# Patient Record
Sex: Male | Born: 1980 | Race: Black or African American | Hispanic: No | Marital: Single | Smoking: Never smoker
Health system: Southern US, Community
[De-identification: ages and names within clinical notes are randomized; demographics above are authoritative.]

---

## 2010-09-25 ENCOUNTER — Encounter: Admission: RE | Admit: 2010-09-25 | Discharge: 2010-09-25 | Payer: Self-pay | Admitting: Infectious Diseases

## 2011-10-26 IMAGING — CR DG CHEST 1V
1 series · 1 of 1 positions shown · non-contrast
Comparison: None

CLINICAL DATA: Positive TB test.

CHEST - 1 VIEW

[w chest pa]
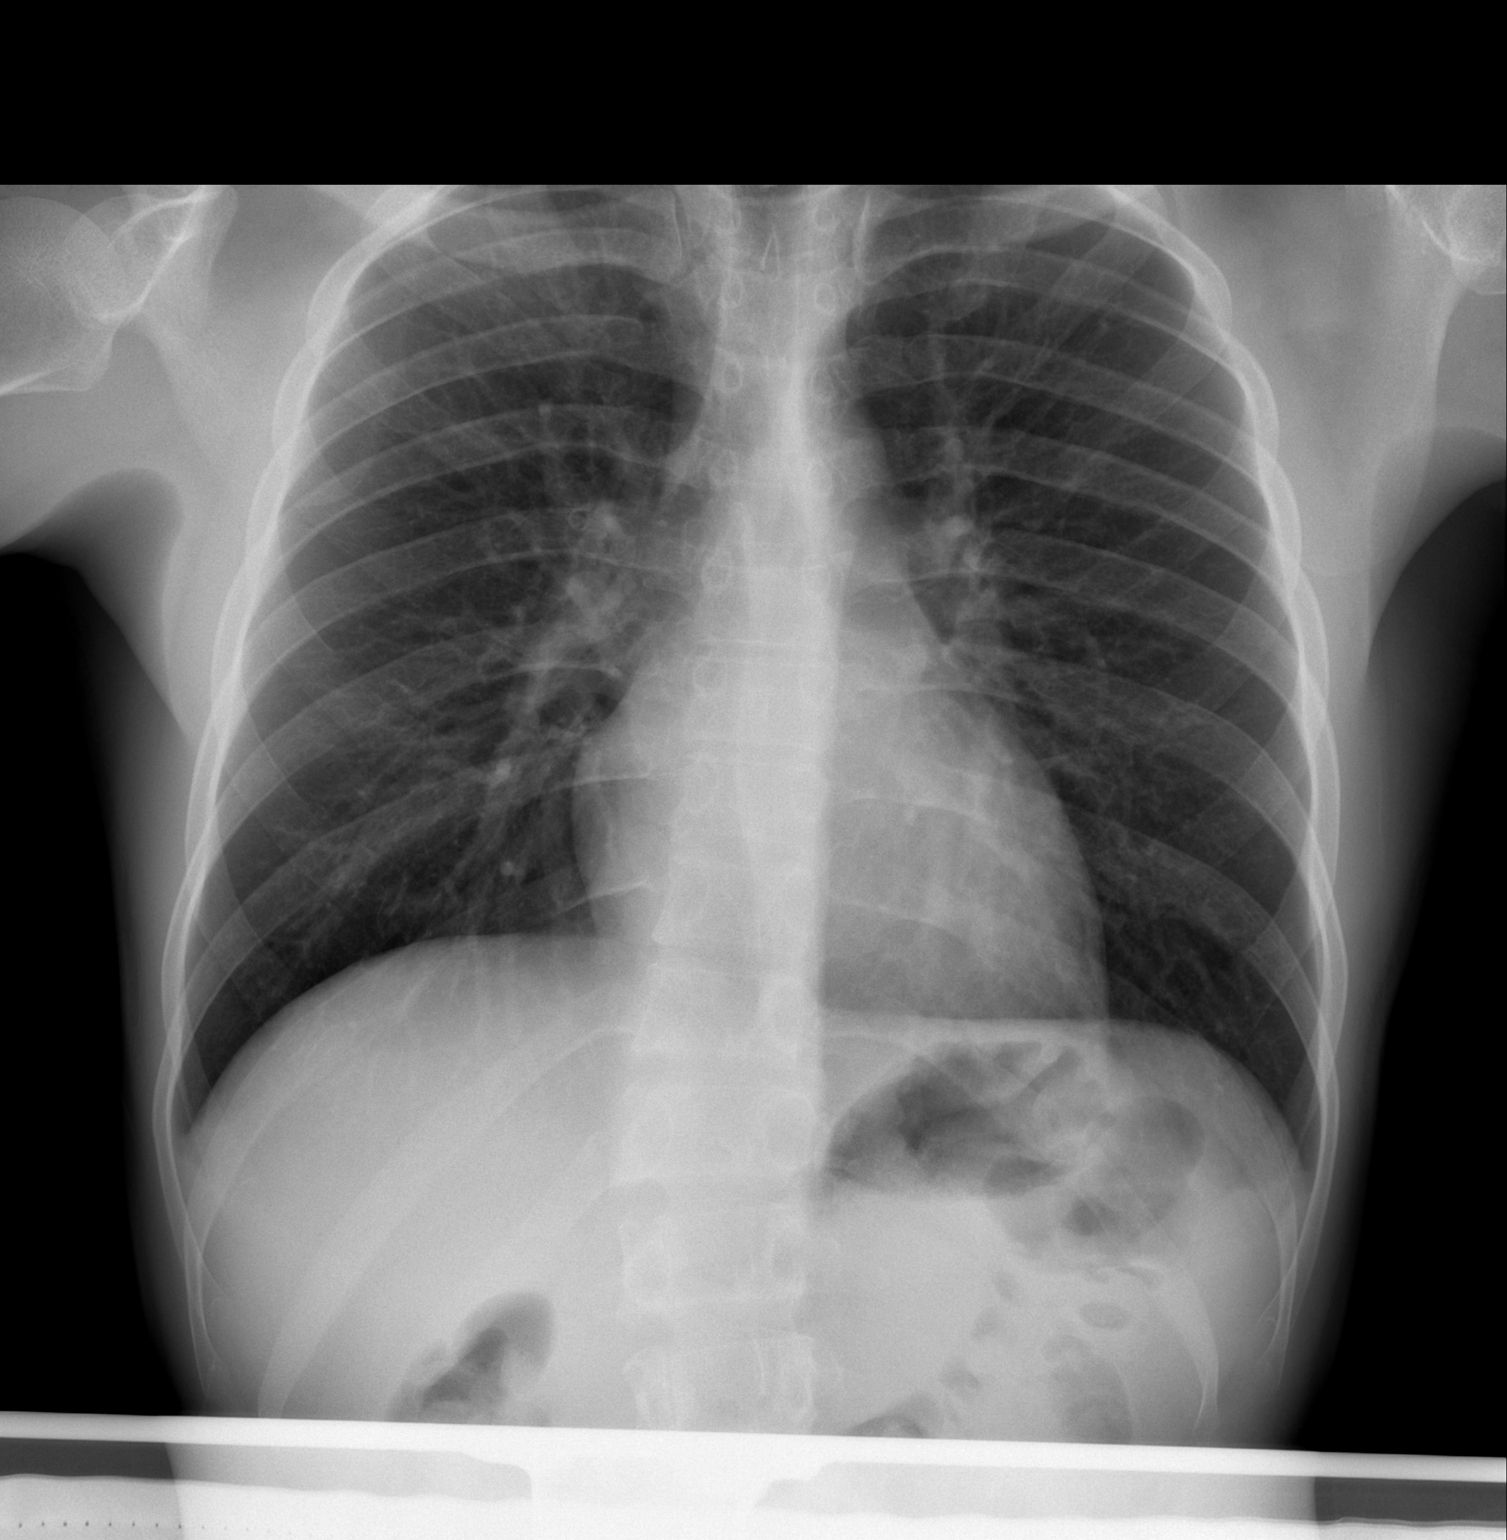

[1 of 1 positions shown; findings below may reference images not displayed]

FINDINGS: Heart and mediastinal contours are within normal limits.
No focal opacities or effusions.  No acute bony abnormality.
IMPRESSION: No active disease.

## 2017-02-27 ENCOUNTER — Ambulatory Visit: Payer: Self-pay

## 2017-02-27 ENCOUNTER — Ambulatory Visit (INDEPENDENT_AMBULATORY_CARE_PROVIDER_SITE_OTHER): Payer: BLUE CROSS/BLUE SHIELD | Admitting: Physician Assistant

## 2017-02-27 VITALS — BP 118/70 | HR 81 | Temp 98.6°F | Resp 18 | Ht 62.6 in | Wt 154.0 lb

## 2017-02-27 DIAGNOSIS — Z113 Encounter for screening for infections with a predominantly sexual mode of transmission: Secondary | ICD-10-CM | POA: Diagnosis not present

## 2017-02-27 DIAGNOSIS — Z0000 Encounter for general adult medical examination without abnormal findings: Secondary | ICD-10-CM | POA: Diagnosis not present

## 2017-02-27 DIAGNOSIS — R002 Palpitations: Secondary | ICD-10-CM

## 2017-02-27 DIAGNOSIS — Z1329 Encounter for screening for other suspected endocrine disorder: Secondary | ICD-10-CM | POA: Diagnosis not present

## 2017-02-27 DIAGNOSIS — Z1389 Encounter for screening for other disorder: Secondary | ICD-10-CM

## 2017-02-27 DIAGNOSIS — Z23 Encounter for immunization: Secondary | ICD-10-CM | POA: Diagnosis not present

## 2017-02-27 DIAGNOSIS — Z114 Encounter for screening for human immunodeficiency virus [HIV]: Secondary | ICD-10-CM | POA: Diagnosis not present

## 2017-02-27 DIAGNOSIS — Z130 Encounter for screening for diseases of the blood and blood-forming organs and certain disorders involving the immune mechanism: Secondary | ICD-10-CM | POA: Diagnosis not present

## 2017-02-27 DIAGNOSIS — Z13220 Encounter for screening for lipoid disorders: Secondary | ICD-10-CM

## 2017-02-27 DIAGNOSIS — Z136 Encounter for screening for cardiovascular disorders: Secondary | ICD-10-CM | POA: Diagnosis not present

## 2017-02-27 DIAGNOSIS — Z131 Encounter for screening for diabetes mellitus: Secondary | ICD-10-CM | POA: Diagnosis not present

## 2017-02-27 LAB — POCT URINALYSIS DIP (MANUAL ENTRY)
BILIRUBIN UA: NEGATIVE
BILIRUBIN UA: NEGATIVE
Glucose, UA: NEGATIVE
Leukocytes, UA: NEGATIVE
NITRITE UA: NEGATIVE
PH UA: 7
RBC UA: NEGATIVE
Spec Grav, UA: 1.025
Urobilinogen, UA: 1

## 2017-02-27 NOTE — Patient Instructions (Signed)
     IF you received an x-ray today, you will receive an invoice from [facility]. Please contact [facility] at [phone] with questions or concerns regarding your invoice.   IF you received labwork today, you will receive an invoice from [facility]. Please contact [facility] at [phone] with questions or concerns regarding your invoice.   Our billing staff will not be able to assist you with questions regarding bills from these companies.  You will be contacted with the lab results as soon as they are available. The fastest way to get your results is to activate your My Chart account. Instructions are located on the last page of this paperwork. If you have not heard from us regarding the results in 2 weeks, please contact this office.     

## 2017-02-27 NOTE — Progress Notes (Signed)
02/27/2017 4:35 PM   DOB: March 21, 1981 / MRN: [id number]  SUBJECTIVE:  Riley Patterson is a 36 y.o. male presenting for an annual physical.  He does not exercise formally. He is a PhD Museum/gallery exhibitions officer. He plan [place].  He and his wife Riley Patterson have three children. He has two boys, on baby and on 5 year old, and a baby girl. Never smokers.  No drugs. Wears seatbelt. No family history of prostate or colon cancer.   He complains of palpitations that occur mostly at night.  Tells me he feels a tightness "for a second" then his heart skips a beat, then he feels normal again.  Denies any SOB, DOE, family history of CAD, leg swelling, orthopnea.   He has no allergies on file.   He  has no past medical history on file.    He  reports that he has never smoked. He has never used smokeless tobacco. He reports that he does not drink alcohol or use drugs. He  reports that he currently engages in sexual activity. He reports using the following method of birth control/protection: None. The patient  has no past surgical history on file.  His family history includes Arthritis in his mother; Diabetes in his father.  Review of Systems  Constitutional: Negative for fever.  Respiratory: Negative for cough, hemoptysis, sputum production, shortness of breath and wheezing.   Cardiovascular: Positive for palpitations. Negative for chest pain, orthopnea, claudication, leg swelling and PND.  Gastrointestinal: Negative for nausea.  Skin: Negative for rash.  Neurological: Negative for dizziness.  Psychiatric/Behavioral: Negative for depression.    The problem list and medications were reviewed and updated by myself where necessary and exist elsewhere in the encounter.   OBJECTIVE:  BP 118/70 (BP Location: Right Arm, Patient Position: Sitting, Cuff Size: Small)   Pulse 81   Temp 98.6 F (37 C) (Oral)   Resp 18   Ht 5' 2.6" (1.59 m)   Wt 154 lb (69.9 kg)   SpO2 99%   BMI 27.63 kg/m   Physical Exam   Constitutional: He is oriented to person, place, and time.  Cardiovascular: Normal rate, regular rhythm, normal heart sounds and intact distal pulses.  Exam reveals no gallop, no friction rub and no decreased pulses.   No murmur heard. Pulmonary/Chest: Effort normal and breath sounds normal. No respiratory distress. He has no decreased breath sounds. He has no wheezes. He has no rhonchi. He has no rales. He exhibits no tenderness.  Musculoskeletal: He exhibits no edema.  Neurological: He is alert and oriented to person, place, and time.  Skin: Skin is warm and dry. He is not diaphoretic.     Visual Acuity Screening   Right eye Left eye Both eyes  Without correction: 20/15-1 20/15 20/13  With correction:        No results found for this or any previous visit (from the past 72 hour(s)).  No results found.  ASSESSMENT AND PLAN:  [place] was seen today for annual exam.  Diagnoses and all orders for this visit:  Annual physical exam  Screening for deficiency anemia -     CBC  Screening for nephropathy -     CMP14+EGFR -     POCT urinalysis dipstick  Screening for lipid disorders -     Lipid panel  Screening for thyroid disorder -     TSH  Screening for diabetes mellitus -     Hemoglobin A1c  Screening for HIV (human immunodeficiency virus) -  HIV antibody  Screening examination for STD (sexually transmitted disease) -     RPR -     GC/Chlamydia Probe Amp  Screening for cardiovascular condition -     EKG 12-Lead  Palpitations: EKG discussed with Dr. Brigitte Pulse.  We both agree th pattern in V2 appears to be early repolarization and thus benign. Family history and exam reassuring.   I advised that he try to get more sleep, drink lots of fluids, limit caffiene and to contact me if the problem continues as we could consider a beta blocker symptomatically.   Need for Tdap vaccination -     Tdap vaccine greater than or equal to 7yo IM    The patient is advised to call  or return to clinic if he does not see an improvement in symptoms, or to seek the care of the closest emergency department if he worsens with the above plan.   Philis Fendt, MHS, PA-C [facility] 02/27/2017 4:35 PM

## 2017-02-28 LAB — CMP14+EGFR
ALK PHOS: 75 IU/L (ref 39–117)
ALT: 21 IU/L (ref 0–44)
AST: 21 IU/L (ref 0–40)
Albumin/Globulin Ratio: 1.8 (ref 1.2–2.2)
Albumin: 4.5 g/dL (ref 3.5–5.5)
BUN/Creatinine Ratio: 13 (ref 9–20)
BUN: 13 mg/dL (ref 6–20)
Bilirubin Total: 0.3 mg/dL (ref 0.0–1.2)
CALCIUM: 8.7 mg/dL (ref 8.7–10.2)
CO2: 22 mmol/L (ref 18–29)
CREATININE: 0.97 mg/dL (ref 0.76–1.27)
Chloride: 104 mmol/L (ref 96–106)
GFR calc Af Amer: 117 mL/min/{1.73_m2} (ref >59)
GFR, EST NON AFRICAN AMERICAN: 101 mL/min/{1.73_m2} (ref >59)
GLUCOSE: 83 mg/dL (ref 65–99)
Globulin, Total: 2.5 g/dL (ref 1.5–4.5)
Potassium: 3.9 mmol/L (ref 3.5–5.2)
Sodium: 144 mmol/L (ref 134–144)
Total Protein: 7 g/dL (ref 6.0–8.5)

## 2017-02-28 LAB — CBC
Hematocrit: 45.6 % (ref 37.5–51.0)
Hemoglobin: 15.6 g/dL (ref 13.0–17.7)
MCH: 28.4 pg (ref 26.6–33.0)
MCHC: 34.2 g/dL (ref 31.5–35.7)
MCV: 83 fL (ref 79–97)
Platelets: 220 x10E3/uL (ref 150–379)
RBC: 5.49 x10E6/uL (ref 4.14–5.80)
RDW: 13.3 % (ref 12.3–15.4)
WBC: 4.8 x10E3/uL (ref 3.4–10.8)

## 2017-02-28 LAB — LIPID PANEL
CHOLESTEROL TOTAL: 149 mg/dL (ref 100–199)
Chol/HDL Ratio: 3.3 ratio units (ref 0.0–5.0)
HDL: 45 mg/dL (ref >39)
LDL CALC: 91 mg/dL (ref 0–99)
TRIGLYCERIDES: 64 mg/dL (ref 0–149)
VLDL CHOLESTEROL CAL: 13 mg/dL (ref 5–40)

## 2017-02-28 LAB — SYPHILIS: RPR W/REFLEX TO RPR TITER AND TREPONEMAL ANTIBODIES, TRADITIONAL SCREENING AND DIAGNOSIS ALGORITHM: RPR Ser Ql: NONREACTIVE

## 2017-02-28 LAB — HIV ANTIBODY (ROUTINE TESTING W REFLEX): HIV Screen 4th Generation wRfx: NONREACTIVE

## 2017-02-28 LAB — TSH: TSH: 1.21 u[IU]/mL (ref 0.450–4.500)

## 2017-02-28 LAB — HEMOGLOBIN A1C
Est. average glucose Bld gHb Est-mCnc: 100 mg/dL
Hgb A1c MFr Bld: 5.1 % (ref 4.8–5.6)

## 2017-03-03 LAB — GC/CHLAMYDIA PROBE AMP

## 2017-12-26 ENCOUNTER — Other Ambulatory Visit: Payer: Self-pay

## 2017-12-26 ENCOUNTER — Encounter (HOSPITAL_COMMUNITY): Payer: Self-pay | Admitting: Emergency Medicine

## 2017-12-26 ENCOUNTER — Emergency Department (HOSPITAL_COMMUNITY)
Admission: EM | Admit: 2017-12-26 | Discharge: 2017-12-27 | Disposition: A | Discharge status: Home / Self Care | Payer: No Typology Code available for payment source | Attending: Emergency Medicine | Admitting: Emergency Medicine

## 2017-12-26 DIAGNOSIS — Z041 Encounter for examination and observation following transport accident: Secondary | ICD-10-CM | POA: Insufficient documentation
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
## 2017-12-27 NOTE — ED Notes (Signed)
Patient left at this time with all belongings. 
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
